# Patient Record
Sex: Female | Born: 1935 | Race: White | Hispanic: No
Health system: Southern US, Community
[De-identification: ages and names within clinical notes are randomized; demographics above are authoritative.]

---

## 2018-06-01 DIAGNOSIS — R29898 Other symptoms and signs involving the musculoskeletal system: Secondary | ICD-10-CM | POA: Diagnosis not present

## 2018-06-01 DIAGNOSIS — F0390 Unspecified dementia without behavioral disturbance: Secondary | ICD-10-CM

## 2018-06-01 DIAGNOSIS — M545 Low back pain: Secondary | ICD-10-CM

## 2018-06-01 DIAGNOSIS — E871 Hypo-osmolality and hyponatremia: Secondary | ICD-10-CM

## 2018-06-01 DIAGNOSIS — I10 Essential (primary) hypertension: Secondary | ICD-10-CM

## 2018-06-01 DIAGNOSIS — R2681 Unsteadiness on feet: Secondary | ICD-10-CM | POA: Diagnosis not present

## 2018-06-02 DIAGNOSIS — R2681 Unsteadiness on feet: Secondary | ICD-10-CM | POA: Diagnosis not present

## 2018-06-02 DIAGNOSIS — I10 Essential (primary) hypertension: Secondary | ICD-10-CM

## 2018-06-02 DIAGNOSIS — R29898 Other symptoms and signs involving the musculoskeletal system: Secondary | ICD-10-CM | POA: Diagnosis not present

## 2018-06-02 DIAGNOSIS — I517 Cardiomegaly: Secondary | ICD-10-CM

## 2018-06-02 DIAGNOSIS — M545 Low back pain: Secondary | ICD-10-CM | POA: Diagnosis not present

## 2018-11-12 ENCOUNTER — Emergency Department (HOSPITAL_COMMUNITY): Payer: Medicare Other

## 2018-11-12 ENCOUNTER — Other Ambulatory Visit: Payer: Self-pay

## 2018-11-12 ENCOUNTER — Emergency Department (HOSPITAL_COMMUNITY)
Admission: EM | Admit: 2018-11-12 | Discharge: 2018-11-13 | Disposition: A | Discharge status: Home / Self Care | Payer: Medicare Other | Attending: Emergency Medicine | Admitting: Emergency Medicine

## 2018-11-12 DIAGNOSIS — R29810 Facial weakness: Secondary | ICD-10-CM | POA: Diagnosis not present

## 2018-11-12 DIAGNOSIS — R05 Cough: Secondary | ICD-10-CM | POA: Diagnosis present

## 2018-11-12 DIAGNOSIS — J069 Acute upper respiratory infection, unspecified: Secondary | ICD-10-CM | POA: Diagnosis not present

## 2018-11-12 DIAGNOSIS — W19XXXA Unspecified fall, initial encounter: Secondary | ICD-10-CM

## 2018-11-12 DIAGNOSIS — Y92009 Unspecified place in unspecified non-institutional (private) residence as the place of occurrence of the external cause: Secondary | ICD-10-CM

## 2018-11-12 LAB — URINALYSIS, ROUTINE W REFLEX MICROSCOPIC
Bacteria, UA: NONE SEEN
Bilirubin Urine: NEGATIVE
GLUCOSE, UA: NEGATIVE mg/dL
Ketones, ur: 5 mg/dL — AB
Leukocytes, UA: NEGATIVE
NITRITE: NEGATIVE
Protein, ur: NEGATIVE mg/dL
Specific Gravity, Urine: 1.018 (ref 1.005–1.030)
pH: 6 (ref 5.0–8.0)

## 2018-11-12 LAB — CBC WITH DIFFERENTIAL/PLATELET
Abs Immature Granulocytes: 0.03 10*3/uL (ref 0.00–0.07)
Basophils Absolute: 0 10*3/uL (ref 0.0–0.1)
Basophils Relative: 0 %
EOS ABS: 0 10*3/uL (ref 0.0–0.5)
Eosinophils Relative: 0 %
HCT: 33.8 % — ABNORMAL LOW (ref 36.0–46.0)
Hemoglobin: 11.2 g/dL — ABNORMAL LOW (ref 12.0–15.0)
Immature Granulocytes: 0 %
Lymphocytes Relative: 3 %
Lymphs Abs: 0.2 10*3/uL — ABNORMAL LOW (ref 0.7–4.0)
MCH: 30.4 pg (ref 26.0–34.0)
MCHC: 33.1 g/dL (ref 30.0–36.0)
MCV: 91.8 fL (ref 80.0–100.0)
Monocytes Absolute: 0.7 10*3/uL (ref 0.1–1.0)
Monocytes Relative: 9 %
Neutro Abs: 6.1 10*3/uL (ref 1.7–7.7)
Neutrophils Relative %: 88 %
Platelets: 169 10*3/uL (ref 150–400)
RBC: 3.68 MIL/uL — ABNORMAL LOW (ref 3.87–5.11)
RDW: 12.6 % (ref 11.5–15.5)
WBC: 7.1 10*3/uL (ref 4.0–10.5)
nRBC: 0 % (ref 0.0–0.2)

## 2018-11-12 LAB — COMPREHENSIVE METABOLIC PANEL
ALT: 19 U/L (ref 0–44)
AST: 30 U/L (ref 15–41)
Albumin: 3.3 g/dL — ABNORMAL LOW (ref 3.5–5.0)
Alkaline Phosphatase: 59 U/L (ref 38–126)
Anion gap: 11 (ref 5–15)
BUN: 10 mg/dL (ref 8–23)
CO2: 21 mmol/L — ABNORMAL LOW (ref 22–32)
Calcium: 8.6 mg/dL — ABNORMAL LOW (ref 8.9–10.3)
Chloride: 99 mmol/L (ref 98–111)
Creatinine, Ser: 0.73 mg/dL (ref 0.44–1.00)
GFR calc Af Amer: >60 mL/min (ref >60)
GFR calc non Af Amer: >60 mL/min (ref >60)
Glucose, Bld: 117 mg/dL — ABNORMAL HIGH (ref 70–99)
POTASSIUM: 3.5 mmol/L (ref 3.5–5.1)
Sodium: 131 mmol/L — ABNORMAL LOW (ref 135–145)
Total Bilirubin: 0.6 mg/dL (ref 0.3–1.2)
Total Protein: 5.9 g/dL — ABNORMAL LOW (ref 6.5–8.1)

## 2018-11-12 MED ORDER — AZITHROMYCIN 250 MG PO TABS: 250.0000 mg | ORAL_TABLET | Freq: Every day | ORAL | 0 refills | Status: AC

## 2018-11-12 NOTE — ED Notes (Addendum)
Spoke with Burnell Blanks [phone]), daughter whom stated that she was unable to pick up her mother tonight due to night vision issues. When asked when she could come and get her, Bowman's response was "well I have responsibilities and I can't get her until tomorrow afternoon". Patient's daughter advised that she cannot just leave her mother at the hospital as this is considered a form of dumping/abandonment. Pt's daughter requested that social work call her because she states "I am an only child, and I just can't take care of her anymore.Marland KitchenMarland KitchenI work full time and I just can't handle her anymore".

## 2018-11-12 NOTE — ED Notes (Signed)
Patient transported to X-ray 

## 2018-11-12 NOTE — ED Provider Notes (Signed)
[facility] EMERGENCY DEPARTMENT Provider Note   CSN: [id number] Arrival date & time: 11/12/18  1936     History   Chief Complaint Chief Complaint  Patient presents with  . Fall    HPI Kelly Villa is a 85 y.o. female.  HPI Patient lives in independent living facility.  Had an unwitnessed fall at 0500 earlier this morning.  She states she believes she slipped and fell.  No loss of consciousness.  Unsure whether she hit her head.  States she has had mild cough.  Denies nausea, vomiting or diarrhea.  No chest or abdominal pain.  Denies urinary symptoms.  No neck pain, focal weakness or numbness. No past medical history on file.  There are no active problems to display for this patient.      OB History   No obstetric history on file.      Home Medications    Prior to Admission medications   Medication Sig Start Date End Date Taking? Authorizing Provider  azithromycin (ZITHROMAX) 250 MG tablet Take 1 tablet (250 mg total) by mouth daily. Take first 2 tablets together, then 1 every day until finished. 11/12/18   Loren Racer, MD    Family History No family history on file.  Social History Social History   Tobacco Use  . Smoking status: Not on file  Substance Use Topics  . Alcohol use: Not on file  . Drug use: Not on file     Allergies   Patient has no allergy information on record.   Review of Systems Review of Systems  Constitutional: Negative for chills and fever.  HENT: Negative for sore throat and trouble swallowing.   Eyes: Negative for visual disturbance.  Respiratory: Positive for cough. Negative for shortness of breath.   Cardiovascular: Negative for chest pain.  Gastrointestinal: Negative for abdominal pain, diarrhea, nausea and vomiting.  Genitourinary: Negative for dysuria, flank pain and frequency.  Musculoskeletal: Negative for back pain, myalgias and neck pain.  Skin: Negative for rash and wound.  Neurological: Negative  for dizziness, weakness, light-headedness, numbness and headaches.  All other systems reviewed and are negative.    Physical Exam Updated Vital Signs BP (!) 121/98   Pulse 82   Temp 98.8 F (37.1 C) (Oral)   Resp 18   SpO2 98%   Physical Exam Vitals signs and nursing note reviewed.  Constitutional:      General: She is not in acute distress.    Appearance: Normal appearance. She is well-developed. She is not ill-appearing.  HENT:     Head: Normocephalic and atraumatic.     Comments: No obvious scalp hematoma.  Midface is stable.  No intraoral lesions.    Mouth/Throat:     Mouth: Mucous membranes are moist.  Eyes:     Extraocular Movements: Extraocular movements intact.     Pupils: Pupils are equal, round, and reactive to light.  Neck:     Musculoskeletal: Normal range of motion and neck supple. No neck rigidity or muscular tenderness.     Comments: No posterior midline cervical tenderness to palpation. Cardiovascular:     Rate and Rhythm: Regular rhythm. Tachycardia present.     Heart sounds: No murmur. No friction rub. No gallop.   Pulmonary:     Effort: Pulmonary effort is normal. No respiratory distress.     Breath sounds: Normal breath sounds. No stridor. No wheezing, rhonchi or rales.  Chest:     Chest wall: No tenderness.  Abdominal:  General: Bowel sounds are normal.     Palpations: Abdomen is soft.     Tenderness: There is no abdominal tenderness. There is no guarding or rebound.  Musculoskeletal: Normal range of motion.        General: No swelling, tenderness, deformity or signs of injury.     Right lower leg: No edema.     Left lower leg: No edema.     Comments: Pelvis stable.  Full range of motion of bilateral lower extremities.  Lymphadenopathy:     Cervical: No cervical adenopathy.  Skin:    General: Skin is warm and dry.     Findings: No erythema or rash.  Neurological:     General: No focal deficit present.     Mental Status: She is alert and  oriented to person, place, and time.     Comments: 5/5 motor in all extremities.  Sensation fully intact.  Psychiatric:        Behavior: Behavior normal.      ED Treatments / Results  Labs (all labs ordered are listed, but only abnormal results are displayed) Labs Reviewed  CBC WITH DIFFERENTIAL/PLATELET - Abnormal; Notable for the following components:      Result Value   RBC 3.68 (*)    Hemoglobin 11.2 (*)    HCT 33.8 (*)    Lymphs Abs 0.2 (*)    All other components within normal limits  COMPREHENSIVE METABOLIC PANEL - Abnormal; Notable for the following components:   Sodium 131 (*)    CO2 21 (*)    Glucose, Bld 117 (*)    Calcium 8.6 (*)    Total Protein 5.9 (*)    Albumin 3.3 (*)    All other components within normal limits  URINALYSIS, ROUTINE W REFLEX MICROSCOPIC - Abnormal; Notable for the following components:   Hgb urine dipstick SMALL (*)    Ketones, ur 5 (*)    All other components within normal limits    EKG EKG Interpretation  Date/Time:  Friday November 12 2018 20:15:53 EST Ventricular Rate:  100 PR Interval:    QRS Duration: 93 QT Interval:  383 QTC Calculation: 494 R Axis:   -9 Text Interpretation:  Sinus tachycardia Probable left atrial enlargement Left ventricular hypertrophy Borderline T abnormalities, lateral leads Borderline prolonged QT interval Artifact in lead(s) I II aVR aVL aVF Confirmed by Loren RacerYelverton, Dorlis Judice ([id number]) on 11/12/2018 9:20:24 PM Also confirmed by Loren RacerYelverton, Nicola Heinemann ([id number]), editor Josephine IgoBelcher, Jessica [phone]([id number])  on 11/13/2018 8:36:10 AM   Radiology Dg Chest 2 View  Result Date: 11/12/2018 CLINICAL DATA:  Cough EXAM: CHEST - 2 VIEW COMPARISON:  None. FINDINGS: Heart and mediastinal contours are within normal limits. No focal opacities or effusions. Compression fractures noted in the mid and lower thoracic spine and upper lumbar spine, age indeterminate. IMPRESSION: No active cardiopulmonary disease. Electronically Signed   By: Charlett NoseKevin  Dover M.D.    On: 11/12/2018 21:45   Ct Head Wo Contrast  Result Date: 11/12/2018 CLINICAL DATA:  Generalized weakness and left arm drooping following a fall at 5 a.m. today. EXAM: CT HEAD WITHOUT CONTRAST TECHNIQUE: Contiguous axial images were obtained from the base of the skull through the vertex without intravenous contrast. COMPARISON:  None. FINDINGS: Brain: Moderately enlarged ventricles and subarachnoid spaces. Marked patchy white matter low density in both cerebral hemispheres. No intracranial hemorrhage, mass lesion or CT evidence of acute infarction. Vascular: No hyperdense vessel or unexpected calcification. Skull: Normal. Negative for fracture or focal lesion. Sinuses/Orbits: Unremarkable.  Other: None. IMPRESSION: 1. No acute abnormality. 2. Moderate diffuse cerebral and cerebellar atrophy. 3. Marked chronic small vessel white matter ischemic changes in both cerebral hemispheres. Electronically Signed   By: Beckie Salts M.D.   On: 11/12/2018 21:54    Procedures Procedures (including critical care time)  Medications Ordered in ED Medications - No data to display   Initial Impression / Assessment and Plan / ED Course  I have reviewed the triage vital signs and the nursing notes.  Pertinent labs & imaging results that were available during my care of the patient were reviewed by me and considered in my medical decision making (see chart for details).    Patient with unwitnessed fall at home.  Reports URI symptoms and low-grade fever in the emergency department.  No evidence of pneumonia.  CT head without acute findings.  Appears to be at baseline mental status.  Will treat for URI and all precautions given.   Final Clinical Impressions(s) / ED Diagnoses   Final diagnoses:  Fall in home, initial encounter  Upper respiratory tract infection, unspecified type    ED Discharge Orders         Ordered    azithromycin (ZITHROMAX) 250 MG tablet  Daily     11/12/18 2323           Loren Racer, MD 11/14/18 [phone]

## 2018-11-12 NOTE — ED Triage Notes (Signed)
Pt arrives via [facility] from home. Pt reportedly had a fall approx 0500 today. Denies loc. No blood thinners. Pt c/o generalized weakness after the fall. L arm droop. LVO score 2 with ems. 156/82, HR 100, RR 18, 96% RA, CBG 155.

## 2018-11-13 NOTE — Progress Notes (Addendum)
2:22PM: CSW spoke with patient's daughter. CSW informed her it does not appear patient has anything admittable and per her insurance will likely not qualify for coverage for reheab. CSW noted patient's daughter insisted on rehab and stated she will not take patient back as she is unable to take care of her and will not pick her up from the ED. CSW informed RN  10:01AM: Patient has active Cherlyn Roberts [phone]  CSW spoke with patient's daughter about picking her up from the ED. CSW noted per daughter she works nights 7PM to 7AM and is worried about nobody being with her at nights. CSW notes patient's daughter expressed concern regarding patient's ability to ambulate around the house on her own. CSW noted patient's daughter affirms not having the ability to provide care for patient at this time. CSW will continue to follow up for a discharge plan.  Tenna Delaine, LCSW, LCAS-A Clinical Social Worker II [phone]

## 2018-11-13 NOTE — ED Notes (Signed)
Pt daughter arrived to pick up pt. 

## 2018-11-13 NOTE — ED Notes (Signed)
Patient sleeping with no distress/respirations unlabored .  

## 2018-11-13 NOTE — ED Notes (Signed)
Therapist, music (SR)Lunch Tray Ordered @ 0956-per Hope, RN-called by [employer]

## 2018-11-13 NOTE — ED Notes (Signed)
Patient's daughter called back, very upset and crying. Explained again that her intent is not to abandon but she doesn't know what else to do, unable to to care of her mother by herself and feels guilty. Patient assured that social work will contact her in the a.m. to discuss possibilities.

## 2018-11-13 NOTE — ED Notes (Signed)
Pt eating lunch tray  

## 2020-06-18 IMAGING — CT CT HEAD W/O CM
4 series · 16 of 47 positions shown, 18 images · non-contrast
Comparison: None.

CLINICAL DATA: Generalized weakness and left arm drooping following
a fall at 5 a.m. today.

EXAM:
CT HEAD WITHOUT CONTRAST
TECHNIQUE: Contiguous axial images were obtained from the base of the skull
through the vertex without intravenous contrast.

[Series 3: head without · axial · non-contrast · 0.42mm/px · z∈[-172,-52]mm · 7 of 32 slices shown, 9 images]
[im 4/32  brain]
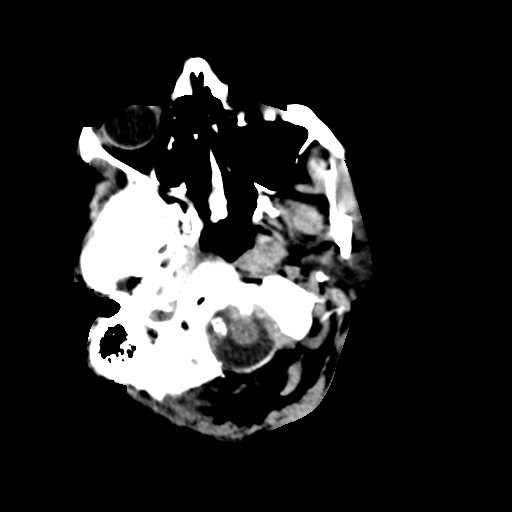
[im 4/32  bone]
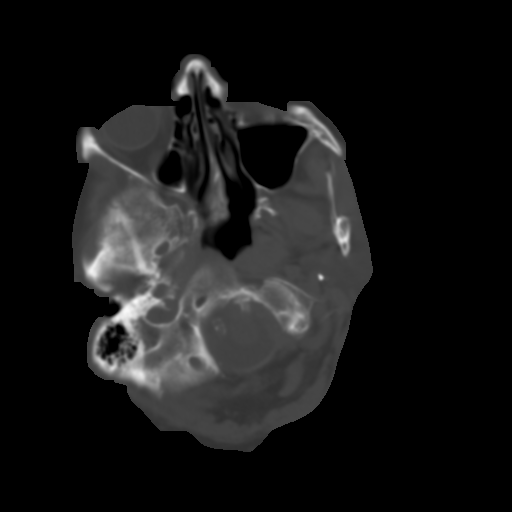
[im 8/32  brain]
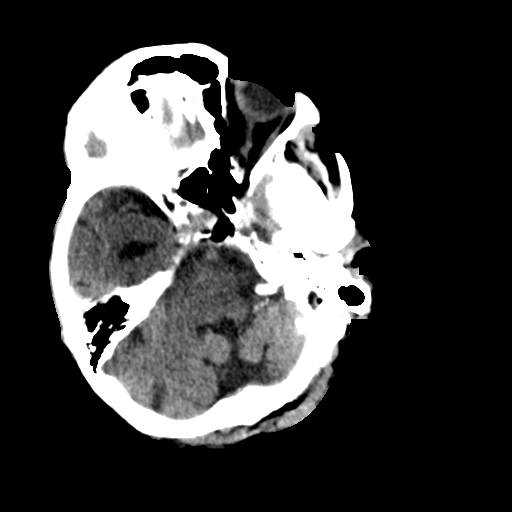
[im 12/32  brain]
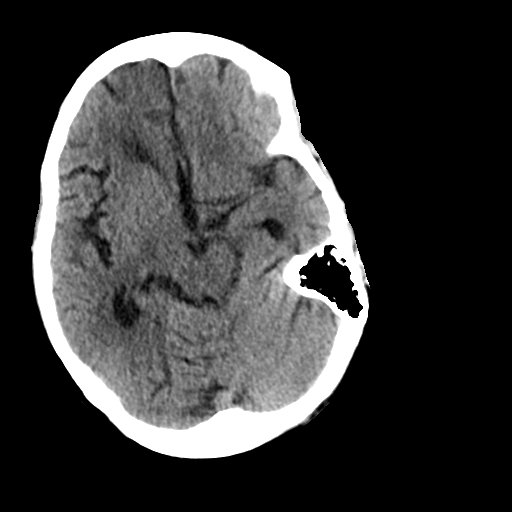
[im 16/32  brain]
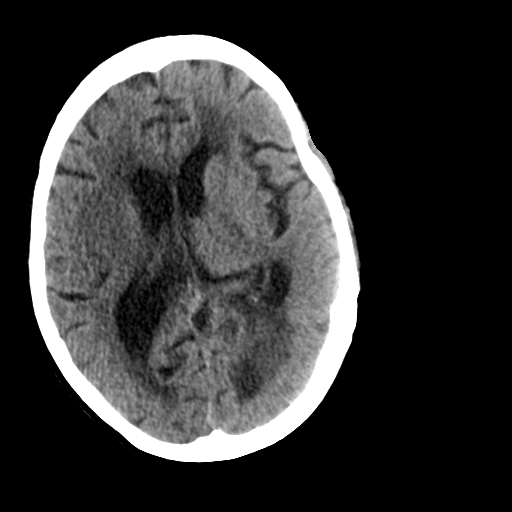
[im 20/32  brain]
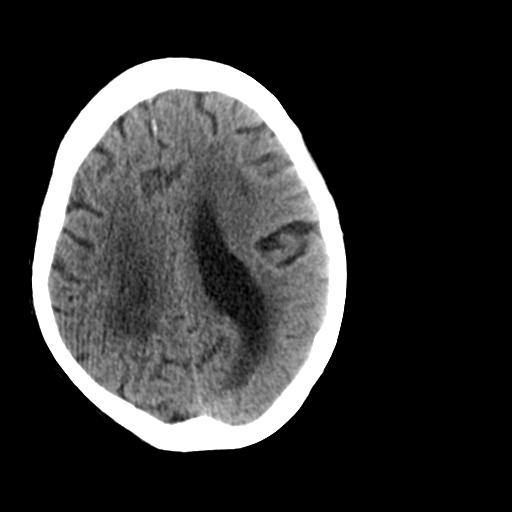
[im 20/32  bone]
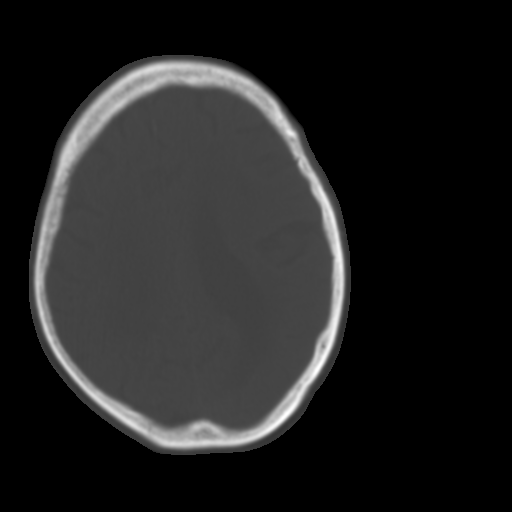
[im 24/32  brain]
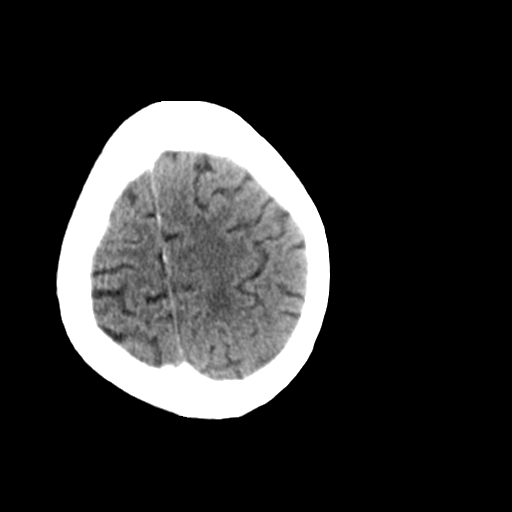
[im 28/32  brain]
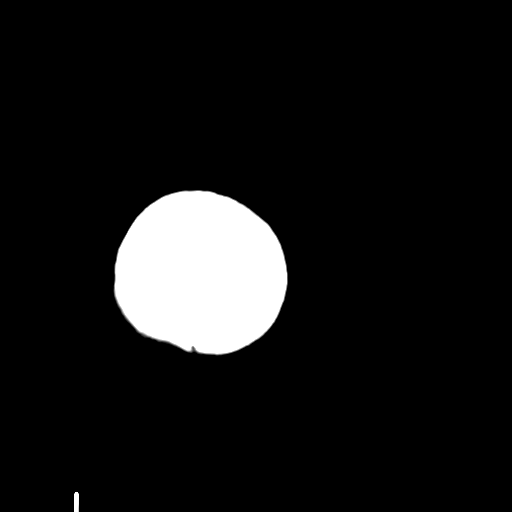

[Series 4: head bone · axial · 0.42mm/px · z∈[-172,-140]mm · 3 of 79 slices shown]
[im 8/79  bone]
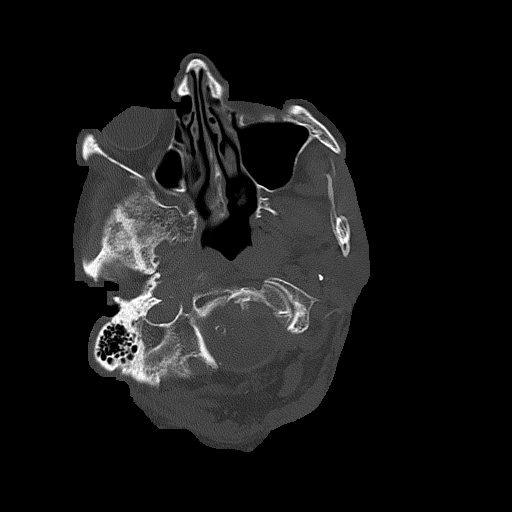
[im 16/79  bone]
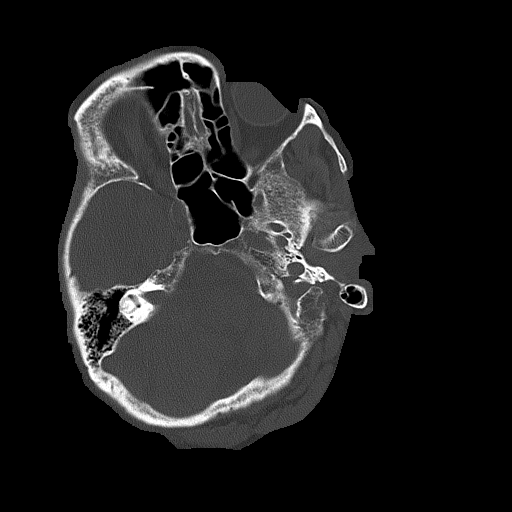
[im 24/79  bone]
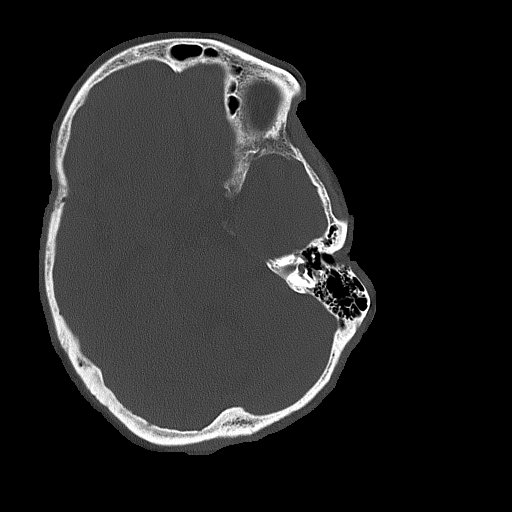

[Series 5: head without cor · coronal · non-contrast · 0.31mm/px · 3 of 70 slices shown]
[im 24/70  brain]
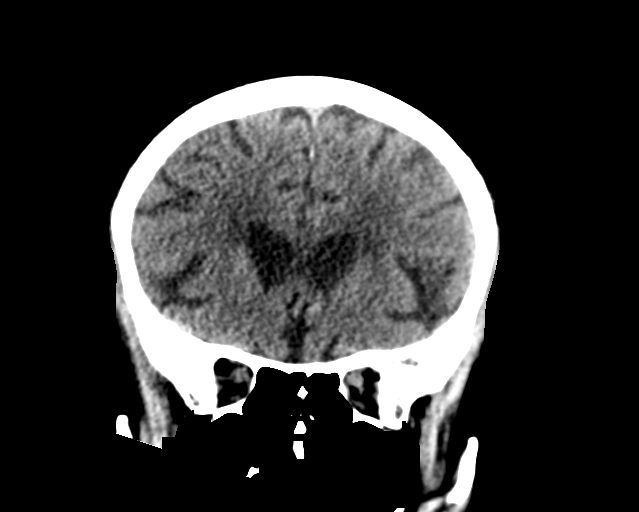
[im 31/70  brain]
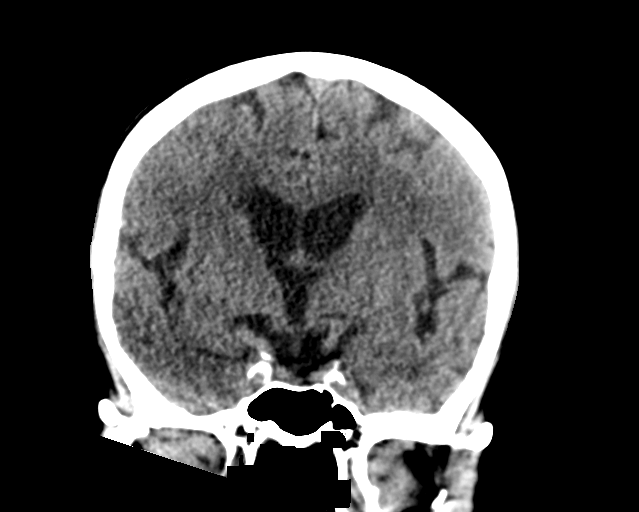
[im 39/70  brain]
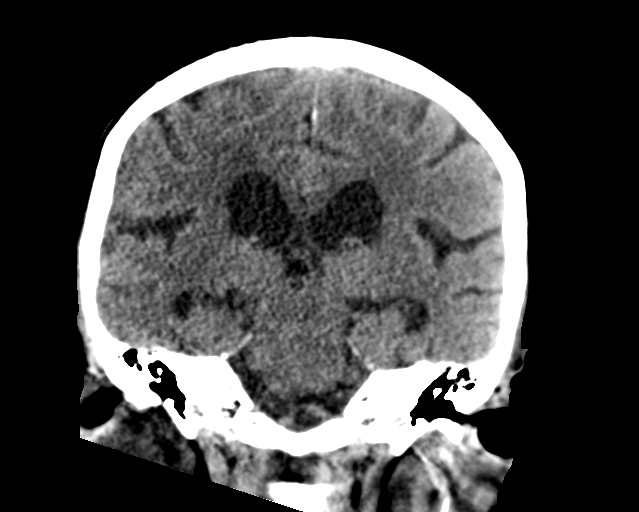

[Series 6: head without sag · sagittal · non-contrast · 0.31mm/px · 3 of 67 slices shown]
[im 25/67  brain]
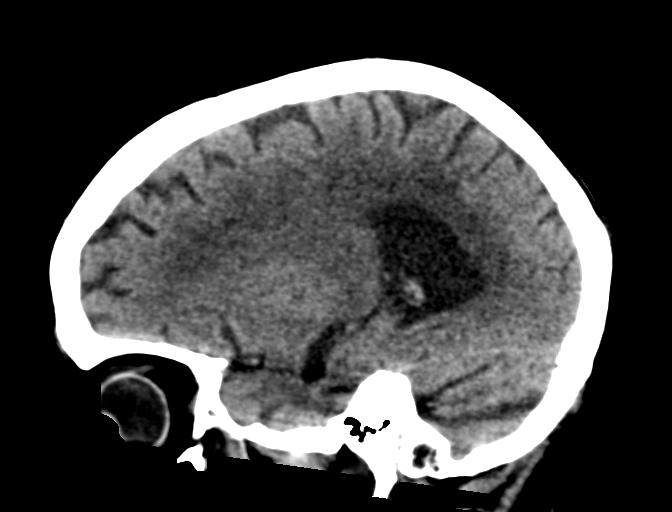
[im 34/67  brain]
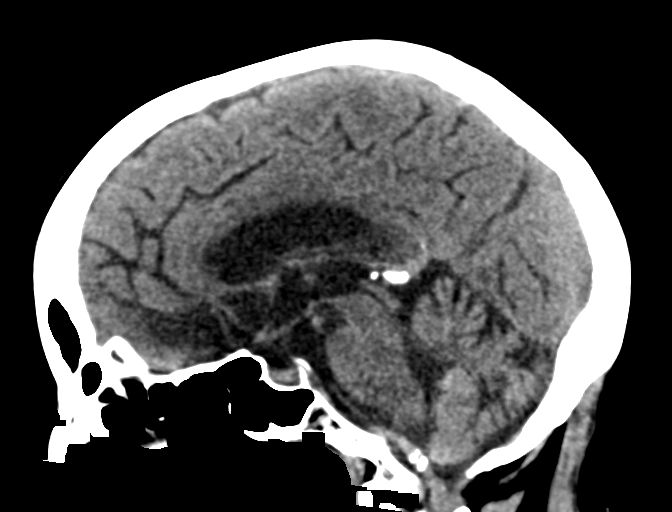
[im 43/67  brain]
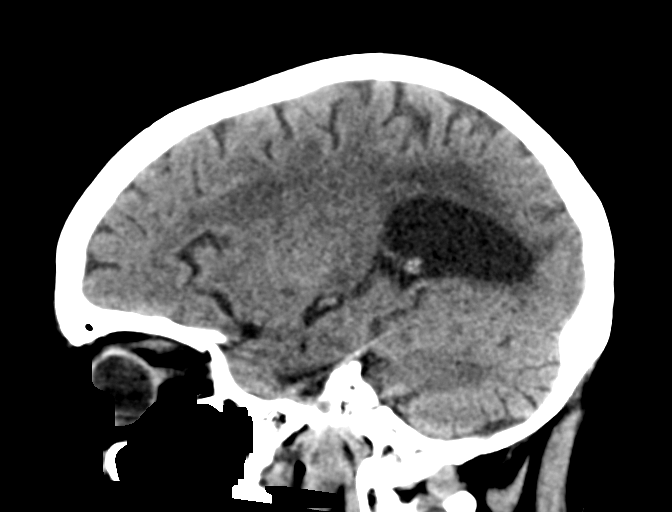

[16 of 47 positions shown; findings below may reference images not displayed]

FINDINGS: Brain: Moderately enlarged ventricles and subarachnoid spaces.
Marked patchy white matter low density in both cerebral hemispheres.
No intracranial hemorrhage, mass lesion or CT evidence of acute
infarction.

Vascular: No hyperdense vessel or unexpected calcification.

Skull: Normal. Negative for fracture or focal lesion.

Sinuses/Orbits: Unremarkable.

Other: None.
IMPRESSION: 1. No acute abnormality.
2. Moderate diffuse cerebral and cerebellar atrophy.
3. Marked chronic small vessel white matter ischemic changes in both
cerebral hemispheres.
